# Patient Record
Sex: Male | Born: 1990 | Race: White | Hispanic: No | Marital: Single | Smoking: Current every day smoker
Health system: Southern US, Community
[De-identification: ages and names within clinical notes are randomized; demographics above are authoritative.]

## PROBLEM LIST (undated history)

## (undated) DIAGNOSIS — J45909 Unspecified asthma, uncomplicated: Secondary | ICD-10-CM

## (undated) HISTORY — PX: OTHER SURGICAL HISTORY: SHX169

## (undated) HISTORY — PX: LYMPH GLAND EXCISION: SHX13

---

## 2012-03-26 ENCOUNTER — Emergency Department (HOSPITAL_COMMUNITY)
Admission: EM | Admit: 2012-03-26 | Discharge: 2012-03-26 | Disposition: A | Discharge status: Home / Self Care | Payer: PRIVATE HEALTH INSURANCE | Attending: Emergency Medicine | Admitting: Emergency Medicine

## 2012-03-26 ENCOUNTER — Encounter (HOSPITAL_COMMUNITY): Payer: Self-pay | Admitting: *Deleted

## 2012-03-26 DIAGNOSIS — J45909 Unspecified asthma, uncomplicated: Secondary | ICD-10-CM | POA: Insufficient documentation

## 2012-03-26 DIAGNOSIS — F17200 Nicotine dependence, unspecified, uncomplicated: Secondary | ICD-10-CM | POA: Insufficient documentation

## 2012-03-26 DIAGNOSIS — F1910 Other psychoactive substance abuse, uncomplicated: Secondary | ICD-10-CM | POA: Insufficient documentation

## 2012-03-26 HISTORY — DX: Unspecified asthma, uncomplicated: J45.909

## 2012-03-26 NOTE — BH Assessment (Signed)
Assessment Note   Joe Lopez is an 21 y.o. male. PT REPORTS HE OCCASIONALLY TAKES OPANA AND XANAX PILLS BUT NOT OFTEN ENOUGH FOR DETOX.  HE DENIES S/I, H/I AND IS NOT PSYCHOTIC.  PT REFERRED TO [facility].      Axis I: Substance Abuse Axis II: Deferred Axis III:  Past Medical History  Diagnosis Date  . Asthma    Axis IV: problems related to social environment Axis V: 61-70 mild symptoms     Past Medical History:  Past Medical History  Diagnosis Date  . Asthma     Past Surgical History  Procedure Date  . Lymph gland excision   . Pyloric stenosis     Family History: History reviewed. No pertinent family history.  Social History:  reports that he has been smoking.  He does not have any smokeless tobacco history on file. He reports that he drinks alcohol. He reports that he uses illicit drugs.  Additional Social History:  Alcohol / Drug Use Pain Medications: OPANA History of alcohol / drug use?: Yes Substance #1 Name of Substance 1: OPIATE-OPANA 1 - Age of First Use: 19 1 - Amount (size/oz): 1-2 PILLS  1 - Frequency: WEEKLY 1 - Duration: 2 YRS 1 - Last Use / Amount: 03/26/11 1 PILL Substance #2 Name of Substance 2: XANAX 2 - Age of First Use: 19 2 - Amount (size/oz): 1 PILL 2 - Frequency: EVERY 2 MONTHS 2 - Duration: 2 YEARS 2 - Last Use / Amount: 1 MONTH AGO  CIWA: CIWA-Ar BP: 124/82 mmHg Pulse Rate: 80  Nausea and Vomiting: no nausea and no vomiting Tactile Disturbances: none Tremor: no tremor Auditory Disturbances: not present Paroxysmal Sweats: no sweat visible Visual Disturbances: not present Anxiety: no anxiety, at ease Headache, Fullness in Head: none present Agitation: normal activity Orientation and Clouding of Sensorium: oriented and can do serial additions CIWA-Ar Total: 0  COWS:    Allergies:  Allergies  Allergen Reactions  . Keflex (Cephalexin)     Home Medications:  (Not in a hospital admission)  OB/GYN Status:  No LMP for  male patient.  General Assessment Data Location of Assessment: [facility] Assessment Services ACT Assessment: Yes Living Arrangements: Parent Can pt return to current living arrangement?: Yes Admission Status: Voluntary Is patient capable of signing voluntary admission?: Yes Transfer from: Acute Hospital Referral Source: MD     Risk to self Suicidal Ideation: No Suicidal Intent: No Is patient at risk for suicide?: No Suicidal Plan?: No Access to Means: No What has been your use of drugs/alcohol within the last 12 months?: OPIATES AND BENZO Previous Attempts/Gestures: No How many times?: 0  Other Self Harm Risks: NA Triggers for Past Attempts: None known Intentional Self Injurious Behavior: None Family Suicide History: No Recent stressful life event(s): Job Loss Persecutory voices/beliefs?: No Depression: No Substance abuse history and/or treatment for substance abuse?: No Suicide prevention information given to non-admitted patients: Not applicable  Risk to Others Homicidal Ideation: No Thoughts of Harm to Others: No Current Homicidal Intent: No Current Homicidal Plan: No Access to Homicidal Means: No History of harm to others?: No Assessment of Violence: None Noted Violent Behavior Description: NA Does patient have access to weapons?: No Criminal Charges Pending?: No Does patient have a court date: No  Psychosis Hallucinations: None noted Delusions: None noted  Mental Status Report Appear/Hygiene: Improved Eye Contact: Good Motor Activity: Freedom of movement Speech: Logical/coherent Level of Consciousness: Alert Mood: Labile Affect: Appropriate to circumstance Anxiety Level: Minimal  Thought Processes: Coherent;Relevant Judgement: Unimpaired Orientation: Person;Place;Time;Situation Obsessive Compulsive Thoughts/Behaviors: None  Cognitive Functioning Concentration: Normal Memory: Recent Intact;Remote Intact IQ: Average Insight: Good Impulse Control:  Fair Appetite: Good Sleep: No Change Total Hours of Sleep: 8  Vegetative Symptoms: None  ADLScreening [facility] Assessment Services) Patient's cognitive ability adequate to safely complete daily activities?: Yes Patient able to express need for assistance with ADLs?: Yes Independently performs ADLs?: Yes  Abuse/Neglect [facility]) Physical Abuse: Denies Verbal Abuse: Denies Sexual Abuse: Denies  Prior Inpatient Therapy Prior Inpatient Therapy: No  Prior Outpatient Therapy Prior Outpatient Therapy: No  ADL Screening (condition at time of admission) Patient's cognitive ability adequate to safely complete daily activities?: Yes Patient able to express need for assistance with ADLs?: Yes Independently performs ADLs?: Yes Weakness of Legs: None Weakness of Arms/Hands: None  Home Assistive Devices/Equipment Home Assistive Devices/Equipment: None  Therapy Consults (therapy consults require a physician order) PT Evaluation Needed: No OT Evalulation Needed: No SLP Evaluation Needed: No Abuse/Neglect Assessment (Assessment to be complete while patient is alone) Physical Abuse: Denies Verbal Abuse: Denies Sexual Abuse: Denies Exploitation of patient/patient's resources: Denies Values / Beliefs Cultural Requests During Hospitalization: None Spiritual Requests During Hospitalization: None Consults Spiritual Care Consult Needed: No Social Work Consult Needed: No      Additional Information 1:1 In Past 12 Months?: No CIRT Risk: No Elopement Risk: No Does patient have medical clearance?: No     Disposition:                                                                                                                               Disposition Disposition of Patient: Referred to [facility]) Patient referred to: Other (Comment) [facility])  On Site Evaluation by:   Reviewed with Physician:     Hattie Perch Winford 03/26/2012 10:18 PM

## 2012-03-26 NOTE — ED Notes (Signed)
Joe Lopez  (ACT) in to speak with pt and mother.   Per mother, she feels pt needs detox from Opana that pt takes approx 2 times a week.   Pt is awake, alert, nad

## 2012-03-26 NOTE — ED Notes (Signed)
Depression, substance abuse opana and xanax.  Over 1 year.

## 2012-03-27 NOTE — ED Provider Notes (Signed)
History     CSN: [id number]  Arrival date & time 03/26/12  2023   First MD Initiated Contact with Patient 03/26/12 2148      Chief Complaint  Patient presents with  . Medical Clearance    (Consider location/radiation/quality/duration/timing/severity/associated sxs/prior treatment) HPI Comments: Patient with no medical hx, no diagnosed psych hx comes in with cc of medical clearance. Patient admits to prescription pill abuse, and wants to clean up. He denies any illicit use today. No suicidal or homicidal ideation. Patient thinks he might have bipolar, but no psych eval done. He does have a pcp.   The history is provided by the patient and a parent.    Past Medical History  Diagnosis Date  . Asthma     Past Surgical History  Procedure Date  . Lymph gland excision   . Pyloric stenosis     History reviewed. No pertinent family history.  History  Substance Use Topics  . Smoking status: Current Everyday Smoker  . Smokeless tobacco: Not on file  . Alcohol Use: Yes      Review of Systems  Constitutional: Negative for activity change and appetite change.  Respiratory: Negative for cough and shortness of breath.   Cardiovascular: Negative for chest pain.  Gastrointestinal: Negative for abdominal pain.  Genitourinary: Negative for dysuria.  Psychiatric/Behavioral: Negative for suicidal ideas, hallucinations, behavioral problems, self-injury and agitation.    Allergies  Keflex  Home Medications  No current outpatient prescriptions on file.  BP 124/82  Pulse 80  Temp 98.3 F (36.8 C) (Oral)  Resp 18  Ht 5\' 11"  (1.803 m)  Wt 175 lb (79.379 kg)  BMI 24.41 kg/m2  SpO2 100%  Physical Exam  Constitutional: He is oriented to person, place, and time. He appears well-developed.  HENT:  Head: Normocephalic.  Eyes: Pupils are equal, round, and reactive to light.  Neck: Normal range of motion. Neck supple.  Cardiovascular: Normal rate, regular rhythm and normal heart  sounds.   No murmur heard. Pulmonary/Chest: Effort normal. No respiratory distress.  Abdominal: Soft. Bowel sounds are normal. He exhibits no distension. There is no tenderness.  Neurological: He is alert and oriented to person, place, and time.  Skin: Skin is warm and dry.  Psychiatric: He has a normal mood and affect. His behavior is normal. Judgment and thought content normal.    ED Course  Procedures (including critical care time)  Labs Reviewed - No data to display No results found.   1. Substance abuse       MDM  Patient comes in for rehab clearance. Behavioral health consulted to provide more resources. Patient has no medical problems, no complains that are organic otherwise. No suicidal ideations, homicidal ideations.        Derwood Kaplan, MD 03/27/12 [phone]

## 2014-05-03 ENCOUNTER — Emergency Department (HOSPITAL_COMMUNITY): Payer: PRIVATE HEALTH INSURANCE

## 2014-05-03 ENCOUNTER — Encounter (HOSPITAL_COMMUNITY): Payer: Self-pay | Admitting: Emergency Medicine

## 2014-05-03 ENCOUNTER — Emergency Department (HOSPITAL_COMMUNITY)
Admission: EM | Admit: 2014-05-03 | Discharge: 2014-05-05 | Disposition: A | Discharge status: Other Acute Inpatient Hospital | Payer: PRIVATE HEALTH INSURANCE | Attending: Emergency Medicine | Admitting: Emergency Medicine

## 2014-05-03 DIAGNOSIS — J45901 Unspecified asthma with (acute) exacerbation: Secondary | ICD-10-CM | POA: Diagnosis not present

## 2014-05-03 DIAGNOSIS — F1210 Cannabis abuse, uncomplicated: Secondary | ICD-10-CM | POA: Diagnosis not present

## 2014-05-03 DIAGNOSIS — F1310 Sedative, hypnotic or anxiolytic abuse, uncomplicated: Secondary | ICD-10-CM

## 2014-05-03 DIAGNOSIS — R630 Anorexia: Secondary | ICD-10-CM | POA: Diagnosis not present

## 2014-05-03 DIAGNOSIS — F17200 Nicotine dependence, unspecified, uncomplicated: Secondary | ICD-10-CM | POA: Insufficient documentation

## 2014-05-03 DIAGNOSIS — R4789 Other speech disturbances: Secondary | ICD-10-CM | POA: Insufficient documentation

## 2014-05-03 DIAGNOSIS — R634 Abnormal weight loss: Secondary | ICD-10-CM | POA: Diagnosis not present

## 2014-05-03 DIAGNOSIS — F1910 Other psychoactive substance abuse, uncomplicated: Secondary | ICD-10-CM

## 2014-05-03 DIAGNOSIS — F1123 Opioid dependence with withdrawal: Secondary | ICD-10-CM

## 2014-05-03 DIAGNOSIS — Z8669 Personal history of other diseases of the nervous system and sense organs: Secondary | ICD-10-CM | POA: Diagnosis not present

## 2014-05-03 DIAGNOSIS — F1410 Cocaine abuse, uncomplicated: Secondary | ICD-10-CM | POA: Insufficient documentation

## 2014-05-03 LAB — URINALYSIS, ROUTINE W REFLEX MICROSCOPIC
BILIRUBIN URINE: NEGATIVE
Glucose, UA: NEGATIVE mg/dL
HGB URINE DIPSTICK: NEGATIVE
KETONES UR: NEGATIVE mg/dL
Leukocytes, UA: NEGATIVE
Nitrite: NEGATIVE
Protein, ur: NEGATIVE mg/dL
SPECIFIC GRAVITY, URINE: 1.024 (ref 1.005–1.030)
UROBILINOGEN UA: 1 mg/dL (ref 0.0–1.0)
pH: 7 (ref 5.0–8.0)

## 2014-05-03 LAB — URINE MICROSCOPIC-ADD ON

## 2014-05-03 LAB — ACETAMINOPHEN LEVEL

## 2014-05-03 LAB — CBC WITH DIFFERENTIAL/PLATELET
Basophils Absolute: 0.1 K/uL (ref 0.0–0.1)
Basophils Relative: 1 % (ref 0–1)
Eosinophils Absolute: 0.4 K/uL (ref 0.0–0.7)
Eosinophils Relative: 4 % (ref 0–5)
HCT: 37.8 % — ABNORMAL LOW (ref 39.0–52.0)
Hemoglobin: 12.8 g/dL — ABNORMAL LOW (ref 13.0–17.0)
Lymphocytes Relative: 34 % (ref 12–46)
Lymphs Abs: 3.3 K/uL (ref 0.7–4.0)
MCH: 29.9 pg (ref 26.0–34.0)
MCHC: 33.9 g/dL (ref 30.0–36.0)
MCV: 88.3 fL (ref 78.0–100.0)
Monocytes Absolute: 1 K/uL (ref 0.1–1.0)
Monocytes Relative: 10 % (ref 3–12)
Neutro Abs: 4.9 K/uL (ref 1.7–7.7)
Neutrophils Relative %: 51 % (ref 43–77)
Platelets: 274 K/uL (ref 150–400)
RBC: 4.28 MIL/uL (ref 4.22–5.81)
RDW: 13.2 % (ref 11.5–15.5)
WBC: 9.7 K/uL (ref 4.0–10.5)

## 2014-05-03 LAB — COMPREHENSIVE METABOLIC PANEL
ALT: 10 U/L (ref 0–53)
ANION GAP: 9 (ref 5–15)
AST: 12 U/L (ref 0–37)
Albumin: 3.6 g/dL (ref 3.5–5.2)
Alkaline Phosphatase: 82 U/L (ref 39–117)
BUN: 7 mg/dL (ref 6–23)
CO2: 31 meq/L (ref 19–32)
CREATININE: 1.02 mg/dL (ref 0.50–1.35)
Calcium: 9.4 mg/dL (ref 8.4–10.5)
Chloride: 100 mEq/L (ref 96–112)
GLUCOSE: 78 mg/dL (ref 70–99)
Potassium: 4.2 mEq/L (ref 3.7–5.3)
Sodium: 140 mEq/L (ref 137–147)
Total Bilirubin: 0.5 mg/dL (ref 0.3–1.2)
Total Protein: 7.5 g/dL (ref 6.0–8.3)

## 2014-05-03 LAB — RAPID URINE DRUG SCREEN, HOSP PERFORMED
Amphetamines: NOT DETECTED
Barbiturates: NOT DETECTED
Benzodiazepines: POSITIVE — AB
Cocaine: POSITIVE — AB
Opiates: NOT DETECTED
Tetrahydrocannabinol: POSITIVE — AB

## 2014-05-03 LAB — SALICYLATE LEVEL: Salicylate Lvl: <2 mg/dL — ABNORMAL LOW (ref 2.8–20.0)

## 2014-05-03 LAB — ETHANOL: Alcohol, Ethyl (B): <11 mg/dL (ref 0–11)

## 2014-05-03 MED ORDER — NICOTINE 21 MG/24HR TD PT24
21.0000 mg | MEDICATED_PATCH | Freq: Every day | TRANSDERMAL | Status: DC
  Administered 2014-05-04: 21 mg via TRANSDERMAL
  Filled 2014-05-03: qty 1

## 2014-05-03 MED ORDER — CLONIDINE HCL 0.1 MG PO TABS
0.1000 mg | ORAL_TABLET | Freq: Four times a day (QID) | ORAL | Status: DC
  Administered 2014-05-04 (×3): 0.1 mg via ORAL
  Filled 2014-05-03 (×3): qty 1

## 2014-05-03 MED ORDER — LOPERAMIDE HCL 2 MG PO CAPS: 2.0000 mg | ORAL_CAPSULE | ORAL | Status: DC | PRN

## 2014-05-03 MED ORDER — ALUM & MAG HYDROXIDE-SIMETH 200-200-20 MG/5ML PO SUSP: 30.0000 mL | ORAL | Status: DC | PRN

## 2014-05-03 MED ORDER — CLONIDINE HCL 0.1 MG PO TABS: 0.1000 mg | ORAL_TABLET | Freq: Every day | ORAL | Status: DC

## 2014-05-03 MED ORDER — ACETAMINOPHEN 325 MG PO TABS
650.0000 mg | ORAL_TABLET | ORAL | Status: DC | PRN
  Administered 2014-05-04: 650 mg via ORAL
  Filled 2014-05-03: qty 2

## 2014-05-03 MED ORDER — HYDROXYZINE HCL 25 MG PO TABS
25.0000 mg | ORAL_TABLET | Freq: Four times a day (QID) | ORAL | Status: DC | PRN
Start: 2014-05-03 — End: 2014-05-05
  Administered 2014-05-04: 25 mg via ORAL
  Filled 2014-05-03: qty 1

## 2014-05-03 MED ORDER — IBUPROFEN 200 MG PO TABS: 600.0000 mg | ORAL_TABLET | Freq: Three times a day (TID) | ORAL | Status: DC | PRN

## 2014-05-03 MED ORDER — ONDANSETRON 4 MG PO TBDP: 4.0000 mg | ORAL_TABLET | Freq: Four times a day (QID) | ORAL | Status: DC | PRN

## 2014-05-03 MED ORDER — DICYCLOMINE HCL 20 MG PO TABS
20.0000 mg | ORAL_TABLET | Freq: Four times a day (QID) | ORAL | Status: DC | PRN
Start: 2014-05-03 — End: 2014-05-05

## 2014-05-03 MED ORDER — CLONIDINE HCL 0.1 MG PO TABS: 0.1000 mg | ORAL_TABLET | ORAL | Status: DC

## 2014-05-03 MED ORDER — METHOCARBAMOL 500 MG PO TABS
500.0000 mg | ORAL_TABLET | Freq: Three times a day (TID) | ORAL | Status: DC | PRN
  Administered 2014-05-04: 500 mg via ORAL
  Filled 2014-05-03: qty 1

## 2014-05-03 MED ORDER — ZOLPIDEM TARTRATE 5 MG PO TABS: 5.0000 mg | ORAL_TABLET | Freq: Every evening | ORAL | Status: DC | PRN

## 2014-05-03 MED ORDER — LORAZEPAM 1 MG PO TABS: 1.0000 mg | ORAL_TABLET | Freq: Three times a day (TID) | ORAL | Status: DC | PRN

## 2014-05-03 MED ORDER — ONDANSETRON HCL 4 MG PO TABS: 4.0000 mg | ORAL_TABLET | Freq: Three times a day (TID) | ORAL | Status: DC | PRN

## 2014-05-03 MED ORDER — NAPROXEN 250 MG PO TABS: 500.0000 mg | ORAL_TABLET | Freq: Two times a day (BID) | ORAL | Status: DC | PRN

## 2014-05-03 NOTE — ED Provider Notes (Signed)
CSN: [id number]     Arrival date & time 05/03/14  2018 History  This chart was scribed for non-physician practitioner, Junious Silk, PA-C working with Layla Maw Ward, DO by Greggory Stallion, ED scribe. This patient was seen in room TR06C/TR06C and the patient's care was started at 9:47 PM.   Chief Complaint  Patient presents with  . Addiction Problem   The history is provided by the patient. No language interpreter was used.   HPI Comments: Joe Lopez is a 23 y.o. male who presents to the Emergency Department requesting detox from opiates. States he has been using for 4 years but PO or snorting. Pt states he additionally took two 0.5 mg klonopin before coming into the ED today. Denies any other drug use today but states he took methadone all weekend. States he was taking about 20 mg methadone per day. Denies SI/HI. Family states that the pt had a seizure last night. They state the pt's father stated that he had two seizures earlier this week. The family members cannot describe the seizure to any extent. The patient reports he synopized. He reports that he was not withdrawing at the time of these "seizures" or LOC. Family reports that he has had decreased appetite in the last few weeks. States that he has lost about 20 pounds in the last 3 weeks due to not eating. Pt was recently given a z-pack for bronchitis. States he finished it a few days ago. Reports continued cough. Family states that they spoke to [facility] and was told to get medically cleared then he could go there for inpatient detox. No suicidal or homicidal ideation.   Past Medical History  Diagnosis Date  . Asthma    Past Surgical History  Procedure Laterality Date  . Lymph gland excision    . Pyloric stenosis     History reviewed. No pertinent family history. History  Substance Use Topics  . Smoking status: Current Every Day Smoker  . Smokeless tobacco: Not on file  . Alcohol Use: Yes    Review of Systems   Constitutional: Positive for appetite change.  Neurological: Positive for seizures.  Psychiatric/Behavioral: Negative for suicidal ideas.  All other systems reviewed and are negative.  Allergies  Keflex  Home Medications   Prior to Admission medications   Medication Sig Start Date End Date Taking? Authorizing Provider  PREDNISONE, PAK, PO Take 1 tablet by mouth See admin instructions. Patient is on taper dose. Patient  is not sure where he is in the course of the medication.   Yes Historical Provider, MD   BP 113/67  Pulse 69  Temp(Src) 97.1 F (36.2 C)  Resp 10  SpO2 99%  Physical Exam  Nursing note and vitals reviewed. Constitutional: He is oriented to person, place, and time. He appears well-developed and well-nourished. No distress.  Somnolent but easily aroused. Slurred speech.   HENT:  Head: Normocephalic and atraumatic.  Right Ear: External ear normal.  Left Ear: External ear normal.  Nose: Nose normal.  Eyes: Conjunctivae are normal.  Neck: Normal range of motion. No tracheal deviation present.  Cardiovascular: Normal rate, regular rhythm and normal heart sounds.   Pulmonary/Chest: Effort normal. No stridor. He has wheezes.  Abdominal: Soft. He exhibits no distension. There is no tenderness.  Musculoskeletal: Normal range of motion.  Neurological: He is alert and oriented to person, place, and time.  Skin: Skin is warm and dry. He is not diaphoretic.  Psychiatric: He has a normal mood  and affect. His behavior is normal. He expresses no homicidal and no suicidal ideation.    ED Course  Procedures (including critical care time)  DIAGNOSTIC STUDIES: Oxygen Saturation is 99% on RA, normal by my interpretation.    COORDINATION OF CARE: 9:52 PM-Discussed treatment plan which includes blood work, chest xray and head CT with pt at bedside and pt agreed to plan. Advised pt that if blood work looks okay, he will be discharged with out patient resources. Spoke with pt's  family and told them that pt's are no longer admitted for opiate detox.  Labs Review Labs Reviewed  CBC WITH DIFFERENTIAL - Abnormal; Notable for the following:    Hemoglobin 12.8 (*)    HCT 37.8 (*)    All other components within normal limits  SALICYLATE LEVEL - Abnormal; Notable for the following:    Salicylate Lvl <2.0 (*)    All other components within normal limits  URINE RAPID DRUG SCREEN (HOSP PERFORMED) - Abnormal; Notable for the following:    Cocaine POSITIVE (*)    Benzodiazepines POSITIVE (*)    Tetrahydrocannabinol POSITIVE (*)    All other components within normal limits  URINALYSIS, ROUTINE W REFLEX MICROSCOPIC - Abnormal; Notable for the following:    Color, Urine AMBER (*)    APPearance TURBID (*)    All other components within normal limits  URINE MICROSCOPIC-ADD ON - Abnormal; Notable for the following:    Casts GRANULAR CAST (*)    All other components within normal limits  URINE CULTURE  COMPREHENSIVE METABOLIC PANEL  ACETAMINOPHEN LEVEL  ETHANOL    Imaging Review Ct Head Wo Contrast  05/03/2014   CLINICAL DATA:  Severe headache.  EXAM: CT HEAD WITHOUT CONTRAST  TECHNIQUE: Contiguous axial images were obtained from the base of the skull through the vertex without intravenous contrast.  COMPARISON:  None.  FINDINGS: The ventricles and sulci are normal. No intraparenchymal hemorrhage, mass effect nor midline shift. No acute large vascular territory infarcts.  No abnormal extra-axial fluid collections. Basal cisterns are patent.  No skull fracture. Minimal hyperostosis frontalis internus. The included ocular globes and orbital contents are non-suspicious. Mild paranasal sinus mucosal thickening without air-fluid levels. The mastoid air cells are well aerated.  IMPRESSION: No acute intracranial process ; normal noncontrast CT of the head.  Mild paranasal sinusitis.   Electronically Signed   By: Awilda Metro   On: 05/03/2014 23:08   Dg Chest Port 1  View  05/03/2014   CLINICAL DATA:  Shortness of breath. Drug overdose. History of asthma.  EXAM: PORTABLE CHEST - 1 VIEW  COMPARISON:  04/06/2014  FINDINGS: Normal heart size and pulmonary vascularity. No focal airspace disease or consolidation in the lungs. No blunting of costophrenic angles. No pneumothorax. Mediastinal contours appear intact. Old fracture deformity of the right clavicle.  IMPRESSION: No active disease.   Electronically Signed   By: Burman Nieves M.D.   On: 05/03/2014 22:42     EKG Interpretation None      MDM   Final diagnoses:  Polysubstance abuse    Patient presents to ED for detox from multiple substances. No SI/HI. Patient willing to stay for TTS evaluation to be placed in inpatient detox facility. Given benzodiazapine abuse, patient would likely benefit from detox. Family is with patient and highly motivated for patient to detox. No SI/HI. Vital signs stable. Patient medically cleared. Discussed case with Dr. Elesa Massed who agrees with plan. Patient / Family / Caregiver informed of clinical course, understand  medical decision-making process, and agree with plan.   I personally performed the services described in this documentation, which was scribed in my presence. The recorded information has been reviewed and is accurate.  Mora Bellman, PA-C 05/04/14 [phone]

## 2014-05-03 NOTE — ED Notes (Signed)
Per pt: mom wants him to seek help for opiate addiction for the past four years. Pt states he uses by PO or snorting. Pt states he took 2.5 mg klonopin  x 2 before coming to ED speech is slurred. Pt also dx with pneumonia, bronchitis, tonsillitis last friday. Pt denies SI/HI.

## 2014-05-04 DIAGNOSIS — F1910 Other psychoactive substance abuse, uncomplicated: Secondary | ICD-10-CM

## 2014-05-04 NOTE — ED Provider Notes (Signed)
Medical screening examination/treatment/procedure(s) were performed by non-physician practitioner and as supervising physician I was immediately available for consultation/collaboration.   EKG Interpretation   Date/Time:  Tuesday May 03 2014 23:27:44 EDT Ventricular Rate:  51 PR Interval:  158 QRS Duration: 92 QT Interval:  414 QTC Calculation: 381 R Axis:   73 Text Interpretation:  Sinus bradycardia with marked sinus arrhythmia  Otherwise normal ECG Confirmed by WARD,  DO, KRISTEN ([id number]) on 05/03/2014  11:31:34 PM        Layla Maw Ward, DO 05/04/14 1655

## 2014-05-04 NOTE — ED Notes (Signed)
Ardeen Jourdain (mother) [phone]

## 2014-05-04 NOTE — Progress Notes (Signed)
Pt's referral has been faxed to the following facilities for detox tx: [facility]- per [facility] can fax [facility]- per [facility] can fax [facility]- per Albin Felling can fax [facility]- per Rosanne Ashing can fax for review  [facility]AC[facility]: [facility]- per Shirley Muscat- per [facility]- per [facility] Guise- per [facility]- per [facility]- per [facility]- gero only per [facility]- per [facility] per Ulice Bold- per [facility]- per [facility] Disposition [facility]

## 2014-05-04 NOTE — ED Notes (Signed)
He states he is not feeling any symptoms of withdrawal at this time,. States he is able to go a day or two without using and not have any sickness. States he may feel a little less energy. States his mom wants him to clean up so he can take his baby mama to court to get visitation. States he hasnt seen his child in about a year. Pt is pleasant. He is eating and drinking well,. He denies any history of seizure. States he had been sick and not eating or drinking for a week or so and he remembers passing out 1-2 times when he would stand up. States he doesn't think he has had a seizure and he also states he was not in withdrawal during this time. He states he has gotten off opiates in the past by using suboxone. Pt with good outlook and positive attitude to get his self detoxed. States he uses about 30 mg of roxicet daily

## 2014-05-04 NOTE — Consult Note (Signed)
[facility] Face-to-Face Psychiatry Consult   Reason for Consult:  Polysubstance abuse including opioids Referring Physician:  Dr. [name] is an 23 y.o. male. Total Time spent with patient: 45 minutes  Assessment: AXIS I:  Polysubstance abuse  (opioid dependence, benzodiazepines abuse, cocaine abuse and cannabis abuse)  AXIS II:  Deferred AXIS III:   Past Medical History  Diagnosis Date  . Asthma    AXIS IV:  economic problems, other psychosocial or environmental problems, problems related to social environment and problems with primary support group AXIS V:  41-50 serious symptoms  Plan:   Case discussed with the staff at him and Dr. Darl Householder Recommend psychiatric Inpatient admission when medically cleared. Supportive therapy provided about ongoing stressors.  Subjective:   Joe Lopez is a 23 y.o. male patient admitted with substance abuse and intoxication.  HPI: Joe Lopez is a 23 y.o. male who presents to the Emergency Department requesting detox from opiates, benzodiazepines and a recent history of withdrawal seizures. Patient complains of body pains, stomach cramps, sweating and feeling hot and cold.  patient is scared that is going to have full-blown opiate withdrawal withdrawal if he does not get detox treatment. Patient reported has been tired of using drugs, unable to function with his job and relationships and seeking for professional assistance. Patient reported he has no previous as detox treatment at rehabilitation services. He has been using for 4 years but PO or snorting.  patient stated he has abused benzodiazepines before coming to the hospital and methadone all weekend.  he was also abuser roxy in the recent past. Patient states he was taking about 20 mg methadone per day from [facility] . Reportedly patient mother and girlfriend states that the pt had a seizure last night.  reportedly patient is not able to care for his basic needs, patient's family reports that  he has had decreased appetite in the last few weeks. States that he has lost about 20 pounds in the last 3 weeks due to not eating.  patient denies current suicidal ideation, homicidal ideation, intention or plans. Patient is no evidence of psychosis  patient urine drug screen is positive for cocaine, benzodiazepines, tetrahydrocannabinol and blood alcohol level is not significant.   HPI Elements:   Location:  substance abuse. Quality:  poor. Severity:  acute vs chronic. Timing:  medical complication like withdrawal seizure.  Past Psychiatric History: Past Medical History  Diagnosis Date  . Asthma     reports that he has been smoking.  He does not have any smokeless tobacco history on file. He reports that he drinks alcohol. He reports that he uses illicit drugs. History reviewed. No pertinent family history.         Allergies:   Allergies  Allergen Reactions  . Keflex [Cephalexin]     rash    ACT Assessment Complete:  NO Objective: Blood pressure 113/69, pulse 69, temperature 97.4 F (36.3 C), temperature source Oral, resp. rate 18, SpO2 97.00%.There is no weight on file to calculate BMI. Results for orders placed during the hospital encounter of 05/03/14 (from the past 72 hour(s))  URINE RAPID DRUG SCREEN (HOSP PERFORMED)     Status: Abnormal   Collection Time    05/03/14 10:12 PM      Result Value Ref Range   Opiates NONE DETECTED  NONE DETECTED   Cocaine POSITIVE (*) NONE DETECTED   Benzodiazepines POSITIVE (*) NONE DETECTED   Amphetamines NONE DETECTED  NONE DETECTED  Tetrahydrocannabinol POSITIVE (*) NONE DETECTED   Barbiturates NONE DETECTED  NONE DETECTED   Comment:            DRUG SCREEN FOR MEDICAL PURPOSES     ONLY.  IF CONFIRMATION IS NEEDED     FOR ANY PURPOSE, NOTIFY LAB     WITHIN 5 DAYS.                LOWEST DETECTABLE LIMITS     FOR URINE DRUG SCREEN     Drug Class       Cutoff (ng/mL)     Amphetamine      1000     Barbiturate      200      Benzodiazepine   195     Tricyclics       093     Opiates          300     Cocaine          300     THC              50  URINALYSIS, ROUTINE W REFLEX MICROSCOPIC     Status: Abnormal   Collection Time    05/03/14 10:12 PM      Result Value Ref Range   Color, Urine AMBER (*) YELLOW   Comment: BIOCHEMICALS MAY BE AFFECTED BY COLOR   APPearance TURBID (*) CLEAR   Specific Gravity, Urine 1.024  1.005 - 1.030   pH 7.0  5.0 - 8.0   Glucose, UA NEGATIVE  NEGATIVE mg/dL   Hgb urine dipstick NEGATIVE  NEGATIVE   Bilirubin Urine NEGATIVE  NEGATIVE   Ketones, ur NEGATIVE  NEGATIVE mg/dL   Protein, ur NEGATIVE  NEGATIVE mg/dL   Urobilinogen, UA 1.0  0.0 - 1.0 mg/dL   Nitrite NEGATIVE  NEGATIVE   Leukocytes, UA NEGATIVE  NEGATIVE  URINE MICROSCOPIC-ADD ON     Status: Abnormal   Collection Time    05/03/14 10:12 PM      Result Value Ref Range   Squamous Epithelial / LPF RARE  RARE   WBC, UA 0-2  <3 WBC/hpf   RBC / HPF 0-2  <3 RBC/hpf   Bacteria, UA RARE  RARE   Casts GRANULAR CAST (*) NEGATIVE   Urine-Other MUCOUS PRESENT    CBC WITH DIFFERENTIAL     Status: Abnormal   Collection Time    05/03/14 10:15 PM      Result Value Ref Range   WBC 9.7  4.0 - 10.5 K/uL   RBC 4.28  4.22 - 5.81 MIL/uL   Hemoglobin 12.8 (*) 13.0 - 17.0 g/dL   HCT 37.8 (*) 39.0 - 52.0 %   MCV 88.3  78.0 - 100.0 fL   MCH 29.9  26.0 - 34.0 pg   MCHC 33.9  30.0 - 36.0 g/dL   RDW 13.2  11.5 - 15.5 %   Platelets 274  150 - 400 K/uL   Neutrophils Relative % 51  43 - 77 %   Neutro Abs 4.9  1.7 - 7.7 K/uL   Lymphocytes Relative 34  12 - 46 %   Lymphs Abs 3.3  0.7 - 4.0 K/uL   Monocytes Relative 10  3 - 12 %   Monocytes Absolute 1.0  0.1 - 1.0 K/uL   Eosinophils Relative 4  0 - 5 %   Eosinophils Absolute 0.4  0.0 - 0.7 K/uL   Basophils Relative 1  0 - 1 %  Basophils Absolute 0.1  0.0 - 0.1 K/uL  COMPREHENSIVE METABOLIC PANEL     Status: None   Collection Time    05/03/14 10:15 PM      Result Value Ref Range    Sodium 140  137 - 147 mEq/L   Potassium 4.2  3.7 - 5.3 mEq/L   Chloride 100  96 - 112 mEq/L   CO2 31  19 - 32 mEq/L   Glucose, Bld 78  70 - 99 mg/dL   BUN 7  6 - 23 mg/dL   Creatinine, Ser 1.02  0.50 - 1.35 mg/dL   Calcium 9.4  8.4 - 10.5 mg/dL   Total Protein 7.5  6.0 - 8.3 g/dL   Albumin 3.6  3.5 - 5.2 g/dL   AST 12  0 - 37 U/L   ALT 10  0 - 53 U/L   Alkaline Phosphatase 82  39 - 117 U/L   Total Bilirubin 0.5  0.3 - 1.2 mg/dL   GFR calc non Af Amer >90  >90 mL/min   GFR calc Af Amer >90  >90 mL/min   Comment: (NOTE)     The eGFR has been calculated using the CKD EPI equation.     This calculation has not been validated in all clinical situations.     eGFR's persistently <90 mL/min signify possible Chronic Kidney     Disease.   Anion gap 9  5 - 15  ACETAMINOPHEN LEVEL     Status: None   Collection Time    05/03/14 10:15 PM      Result Value Ref Range   Acetaminophen (Tylenol), Serum <15.0  10 - 30 ug/mL   Comment:            THERAPEUTIC CONCENTRATIONS VARY     SIGNIFICANTLY. A RANGE OF 10-30     ug/mL MAY BE AN EFFECTIVE     CONCENTRATION FOR MANY PATIENTS.     HOWEVER, SOME ARE BEST TREATED     AT CONCENTRATIONS OUTSIDE THIS     RANGE.     ACETAMINOPHEN CONCENTRATIONS     >150 ug/mL AT 4 HOURS AFTER     INGESTION AND >50 ug/mL AT 12     HOURS AFTER INGESTION ARE     OFTEN ASSOCIATED WITH TOXIC     REACTIONS.  SALICYLATE LEVEL     Status: Abnormal   Collection Time    05/03/14 10:15 PM      Result Value Ref Range   Salicylate Lvl <4.1 (*) 2.8 - 20.0 mg/dL  ETHANOL     Status: None   Collection Time    05/03/14 10:15 PM      Result Value Ref Range   Alcohol, Ethyl (B) <11  0 - 11 mg/dL   Comment:            LOWEST DETECTABLE LIMIT FOR     SERUM ALCOHOL IS 11 mg/dL     FOR MEDICAL PURPOSES ONLY   Labs are reviewed and are pertinent for cocaine, benzo and THC.  Current Facility-Administered Medications  Medication Dose Route Frequency Provider Last Rate Last  Dose  . acetaminophen (TYLENOL) tablet 650 mg  650 mg Oral Q4H PRN Elwyn Lade, PA-C   650 mg at 05/04/14 0124  . alum & mag hydroxide-simeth (MAALOX/MYLANTA) 200-200-20 MG/5ML suspension 30 mL  30 mL Oral PRN Elwyn Lade, PA-C      . cloNIDine (CATAPRES) tablet 0.1 mg  0.1 mg Oral QID Jarrett Soho  S Merrell, PA-C   0.1 mg at 05/04/14 0758   Followed by  . [START ON 05/06/2014] cloNIDine (CATAPRES) tablet 0.1 mg  0.1 mg Oral BH-qamhs Elwyn Lade, PA-C       Followed by  . [START ON 05/09/2014] cloNIDine (CATAPRES) tablet 0.1 mg  0.1 mg Oral QAC breakfast Elwyn Lade, PA-C      . dicyclomine (BENTYL) tablet 20 mg  20 mg Oral Q6H PRN Elwyn Lade, PA-C      . hydrOXYzine (ATARAX/VISTARIL) tablet 25 mg  25 mg Oral Q6H PRN Elwyn Lade, PA-C   25 mg at 05/04/14 0758  . ibuprofen (ADVIL,MOTRIN) tablet 600 mg  600 mg Oral Q8H PRN Elwyn Lade, PA-C      . loperamide (IMODIUM) capsule 2-4 mg  2-4 mg Oral PRN Elwyn Lade, PA-C      . LORazepam (ATIVAN) tablet 1 mg  1 mg Oral Q8H PRN Elwyn Lade, PA-C      . methocarbamol (ROBAXIN) tablet 500 mg  500 mg Oral Q8H PRN Elwyn Lade, PA-C   500 mg at 05/04/14 3267  . naproxen (NAPROSYN) tablet 500 mg  500 mg Oral BID PRN Elwyn Lade, PA-C      . nicotine (NICODERM CQ - dosed in mg/24 hours) patch 21 mg  21 mg Transdermal Daily Elwyn Lade, PA-C   21 mg at 05/04/14 0759  . ondansetron (ZOFRAN) tablet 4 mg  4 mg Oral Q8H PRN Elwyn Lade, PA-C      . ondansetron (ZOFRAN-ODT) disintegrating tablet 4 mg  4 mg Oral Q6H PRN Elwyn Lade, PA-C      . zolpidem (AMBIEN) tablet 5 mg  5 mg Oral QHS PRN Elwyn Lade, PA-C       Current Outpatient Prescriptions  Medication Sig Dispense Refill  . PREDNISONE, PAK, PO Take 1 tablet by mouth See admin instructions. Patient is on taper dose. Patient  is not sure where he is in the course of the medication.        Psychiatric Specialty Exam: Physical Exam Full  physical performed in Emergency Department. I have reviewed this assessment and concur with its findings.   Review of Systems  Constitutional: Positive for malaise/fatigue.  Musculoskeletal: Positive for myalgias.  Neurological: Positive for weakness.  Psychiatric/Behavioral: Positive for depression and substance abuse. The patient is nervous/anxious and has insomnia.     Blood pressure 113/69, pulse 69, temperature 97.4 F (36.3 C), temperature source Oral, resp. rate 18, SpO2 97.00%.There is no weight on file to calculate BMI.  General Appearance: Disheveled and Guarded  Eye Sport and exercise psychologist::  Fair  Speech:  Clear and Coherent and Slow  Volume:  Decreased  Mood:  Anxious, Depressed, Hopeless and Worthless  Affect:  Depressed and Flat  Thought Process:  Coherent and Goal Directed  Orientation:  Full (Time, Place, and Person)  Thought Content:  WDL  Suicidal Thoughts:  No  Homicidal Thoughts:  No  Memory:  Immediate;   Fair Recent;   Fair  Judgement:  Impaired  Insight:  Lacking  Psychomotor Activity:  Psychomotor Retardation  Concentration:  Fair  Recall:  [place] of Knowledge:Fair  Language: Good  Akathisia:  NA  Handed:  Right  AIMS (if indicated):     Assets:  Communication Skills Desire for Improvement Housing Intimacy Leisure Time Physical Health Resilience Social Support Transportation Vocational/Educational  Sleep:      Musculoskeletal: Strength & Muscle Tone:  within normal limits Gait & Station: normal Patient leans: N/A  Treatment Plan Summary: Daily contact with patient to assess and evaluate symptoms and progress in treatment Medication management Recommended inpatient detox treatment for opiates, benzodiazepines and alcohol (clonidine and Librium protocols)   Celie Desrochers,JANARDHAHA R. 05/04/2014 9:59 AM

## 2014-05-04 NOTE — Progress Notes (Signed)
Pt's referral faxed to [facility], per Melissa pt being d/c'd this evening resulting in a male bed available.  Referral along with prescreen has been faxed for review.   Tomi Bamberger Disposition MHT

## 2014-05-05 LAB — URINE CULTURE
COLONY COUNT: NO GROWTH
CULTURE: NO GROWTH

## 2014-05-05 NOTE — ED Notes (Signed)
Contacted [facility] for transfer to [facility]

## 2014-05-05 NOTE — Progress Notes (Signed)
Writer followed up on the referral to [facility]. Per nurse Marylu Lund) the patient has been accepted to [facility] and he can to their facility after 9am.  Dr. Margot Chimes is the accepting doctor.   Writer informed the ER MD Dr. Hyacinth Meeker.   Writer informed the nurse Zella Ball) of the patients disposition. The number to give report to at [facility] is [phone].  The name of the nurse at [facility] to give report to is [place].      The nurse will arrange transportation through [facility] for the patient to go to [facility].

## 2014-05-05 NOTE — ED Provider Notes (Signed)
Accpeted at [facility]  Vida Roller, MD 05/05/14 [phone]

## 2014-05-05 NOTE — ED Notes (Signed)
Called mother to ask about clothing.

## 2016-04-15 IMAGING — CT CT HEAD W/O CM
1 of 2 series · 16 of 30 positions shown, 20 images · non-contrast
Comparison: None.

CLINICAL DATA: Severe headache.

EXAM:
CT HEAD WITHOUT CONTRAST
TECHNIQUE: Contiguous axial images were obtained from the base of the skull
through the vertex without intravenous contrast.

[Series 3: head 2.0 h70h · axial · 0.43mm/px · z∈[-99,+41]mm · 16 of 78 slices shown, 20 images]
[im 4/78  brain]
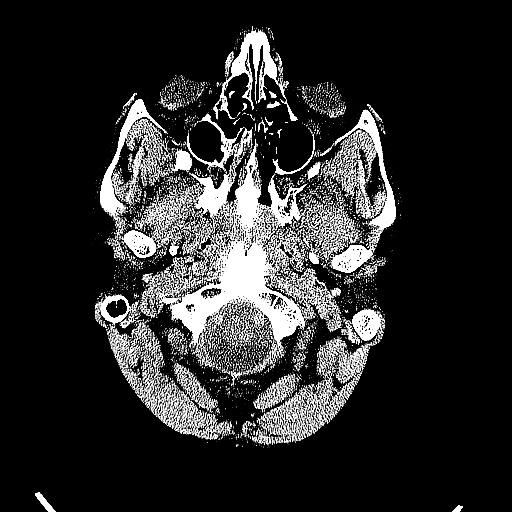
[im 4/78  bone]
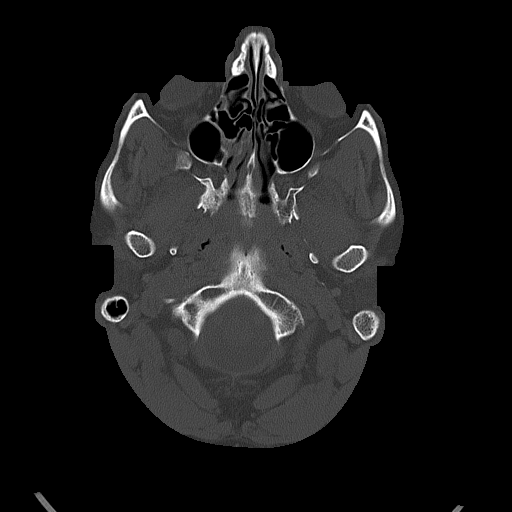
[im 8/78  brain]
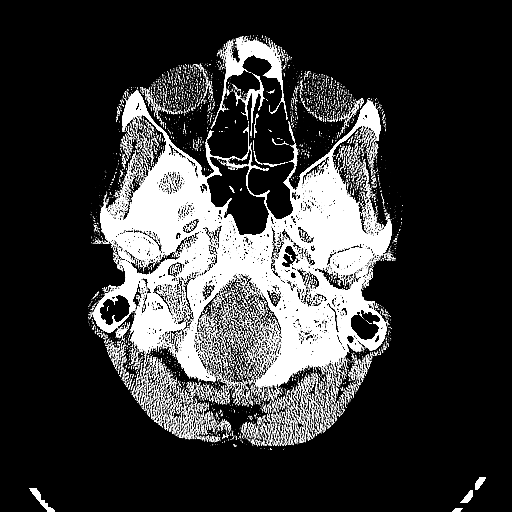
[im 12/78  brain]
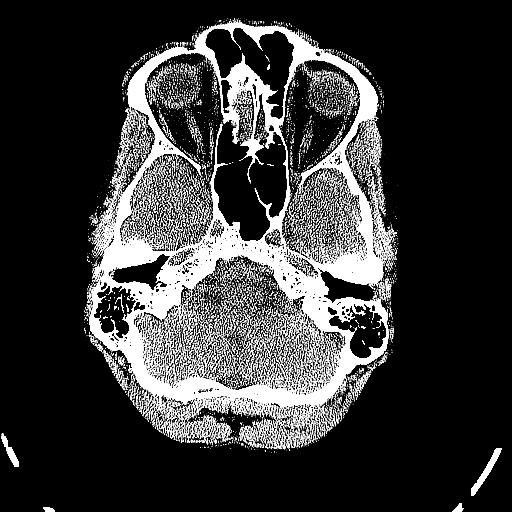
[im 20/78  brain]
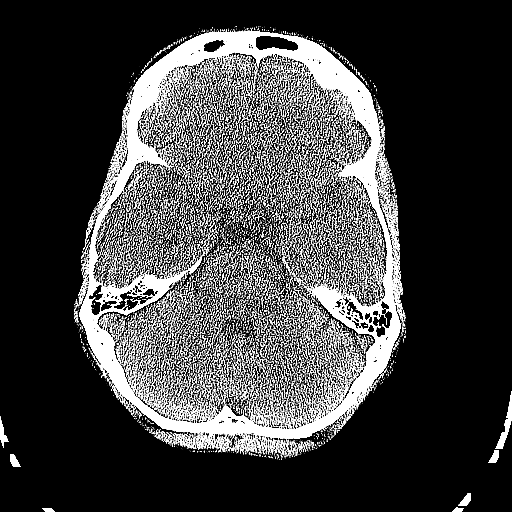
[im 24/78  brain]
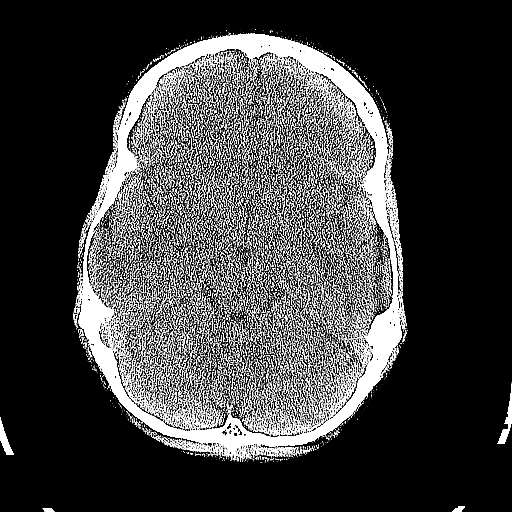
[im 24/78  bone]
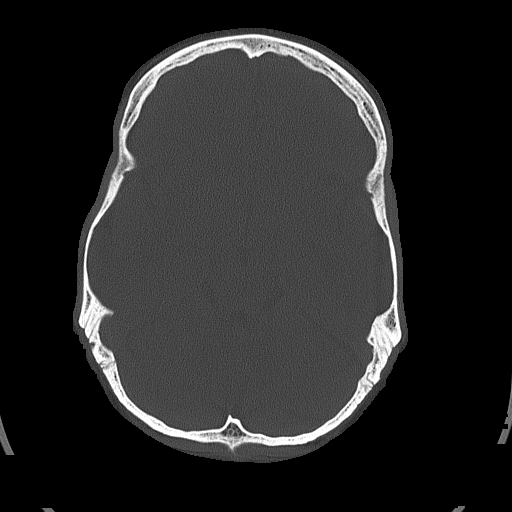
[im 27/78  brain]
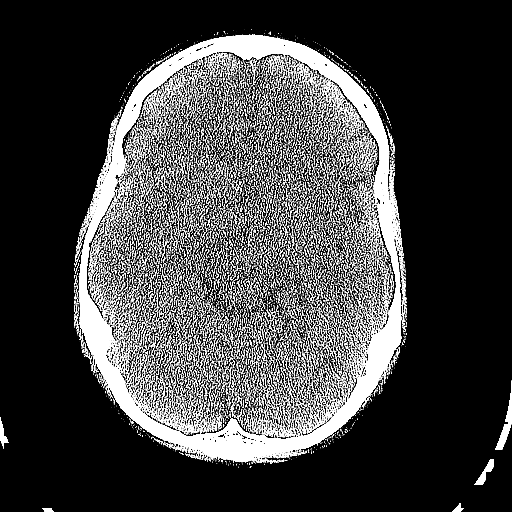
[im 31/78  brain]
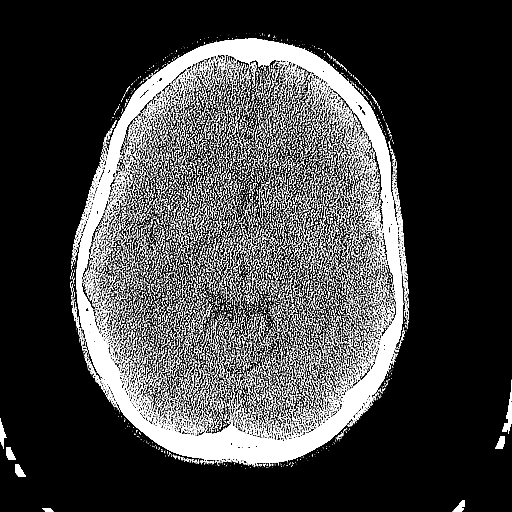
[im 35/78  brain]
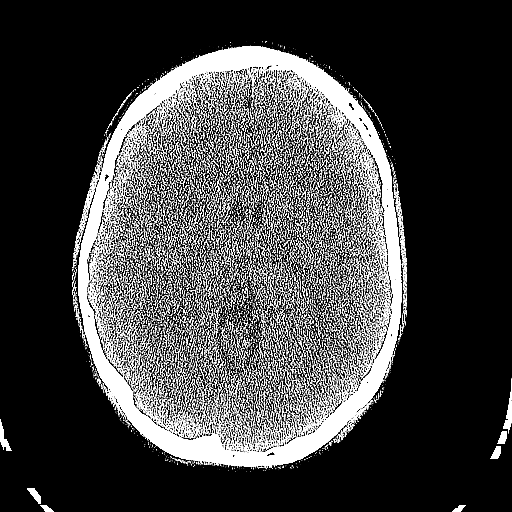
[im 43/78  brain]
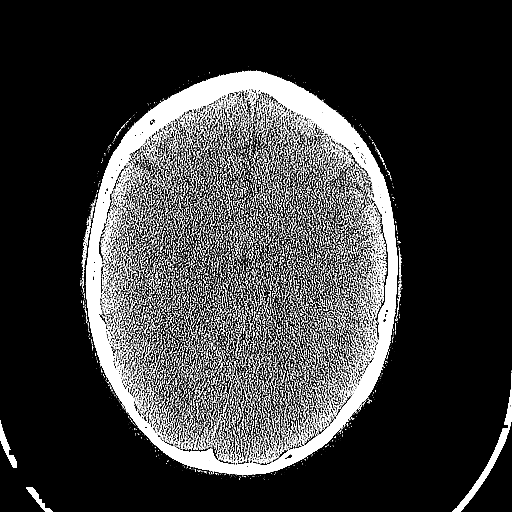
[im 43/78  bone]
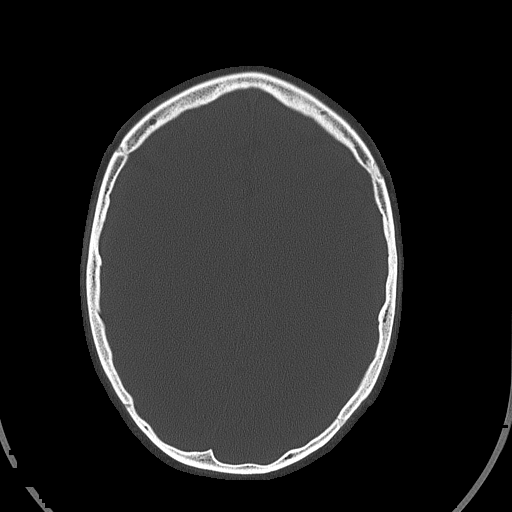
[im 47/78  brain]
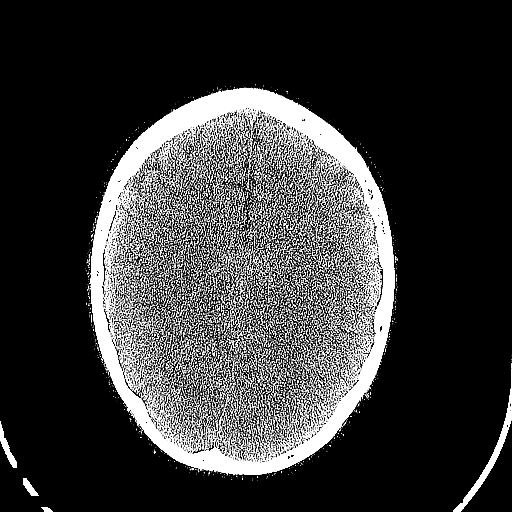
[im 51/78  brain]
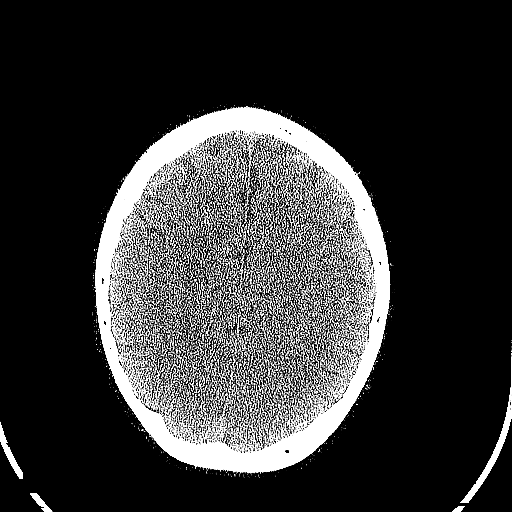
[im 54/78  brain]
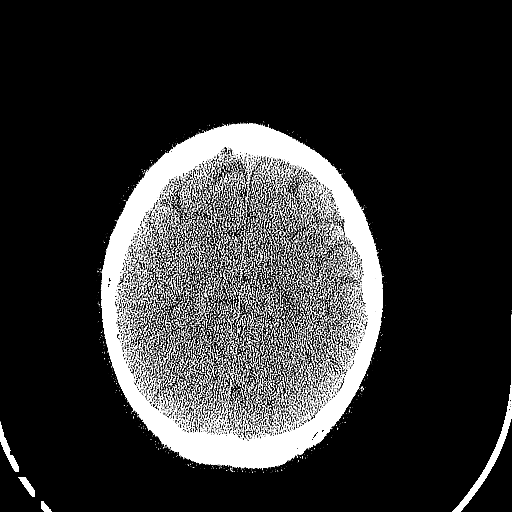
[im 58/78  brain]
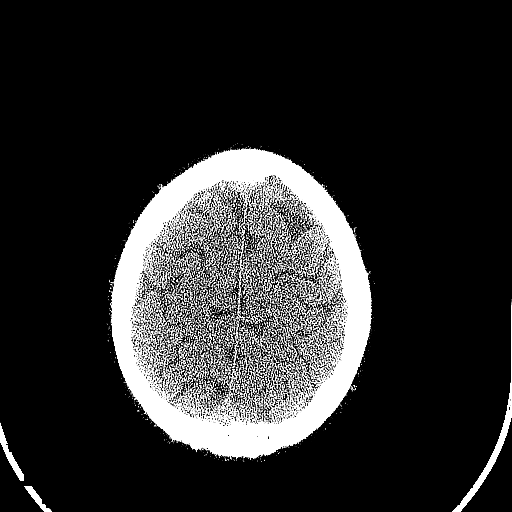
[im 58/78  bone]
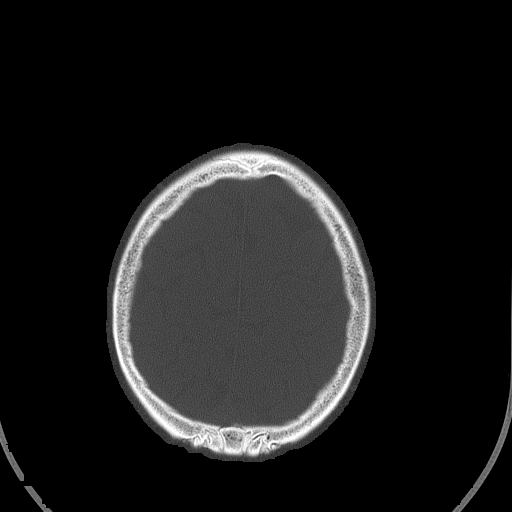
[im 66/78  brain]
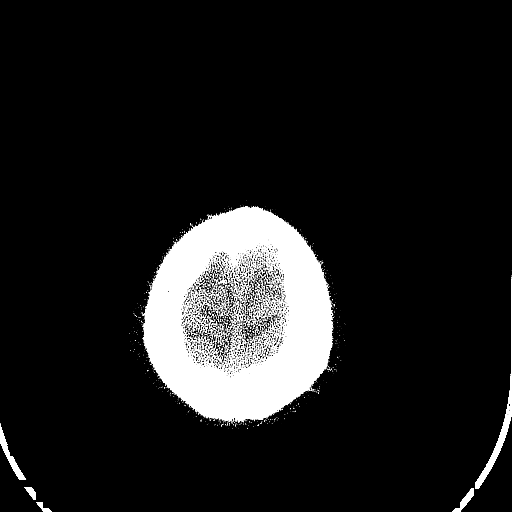
[im 70/78  brain]
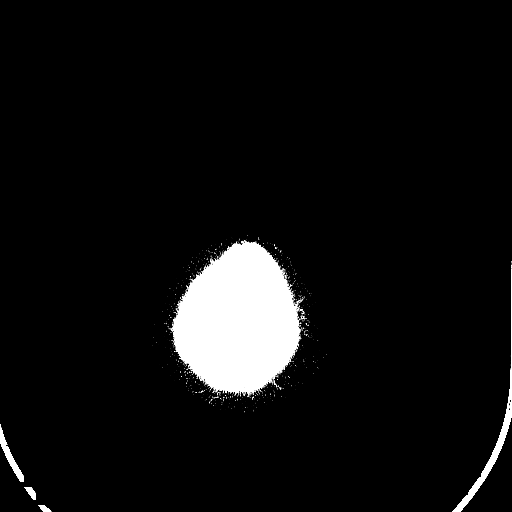
[im 74/78  brain]
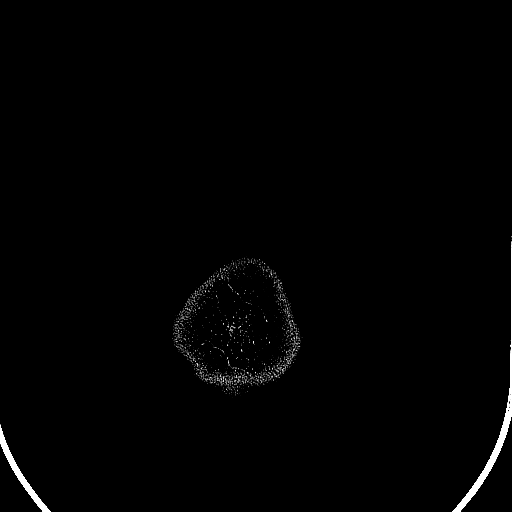

[16 of 30 positions shown; findings below may reference images not displayed]

FINDINGS: The ventricles and sulci are normal. No intraparenchymal hemorrhage,
mass effect nor midline shift. No acute large vascular territory
infarcts.

No abnormal extra-axial fluid collections. Basal cisterns are
patent.

No skull fracture. Minimal hyperostosis frontalis internus. The
included ocular globes and orbital contents are non-suspicious. Mild
paranasal sinus mucosal thickening without air-fluid levels. The
mastoid air cells are well aerated.
IMPRESSION: No acute intracranial process ; normal noncontrast CT of the head.

Mild paranasal sinusitis.

  By: Nomasibulele Mo[place]he

## 2016-04-15 IMAGING — CR DG CHEST 1V PORT
2 series · 2 of 2 positions shown · non-contrast
Comparison: 04/06/2014

CLINICAL DATA: Shortness of breath. Drug overdose. History of
asthma.

EXAM:
PORTABLE CHEST - 1 VIEW

[AP (1 of 2)]
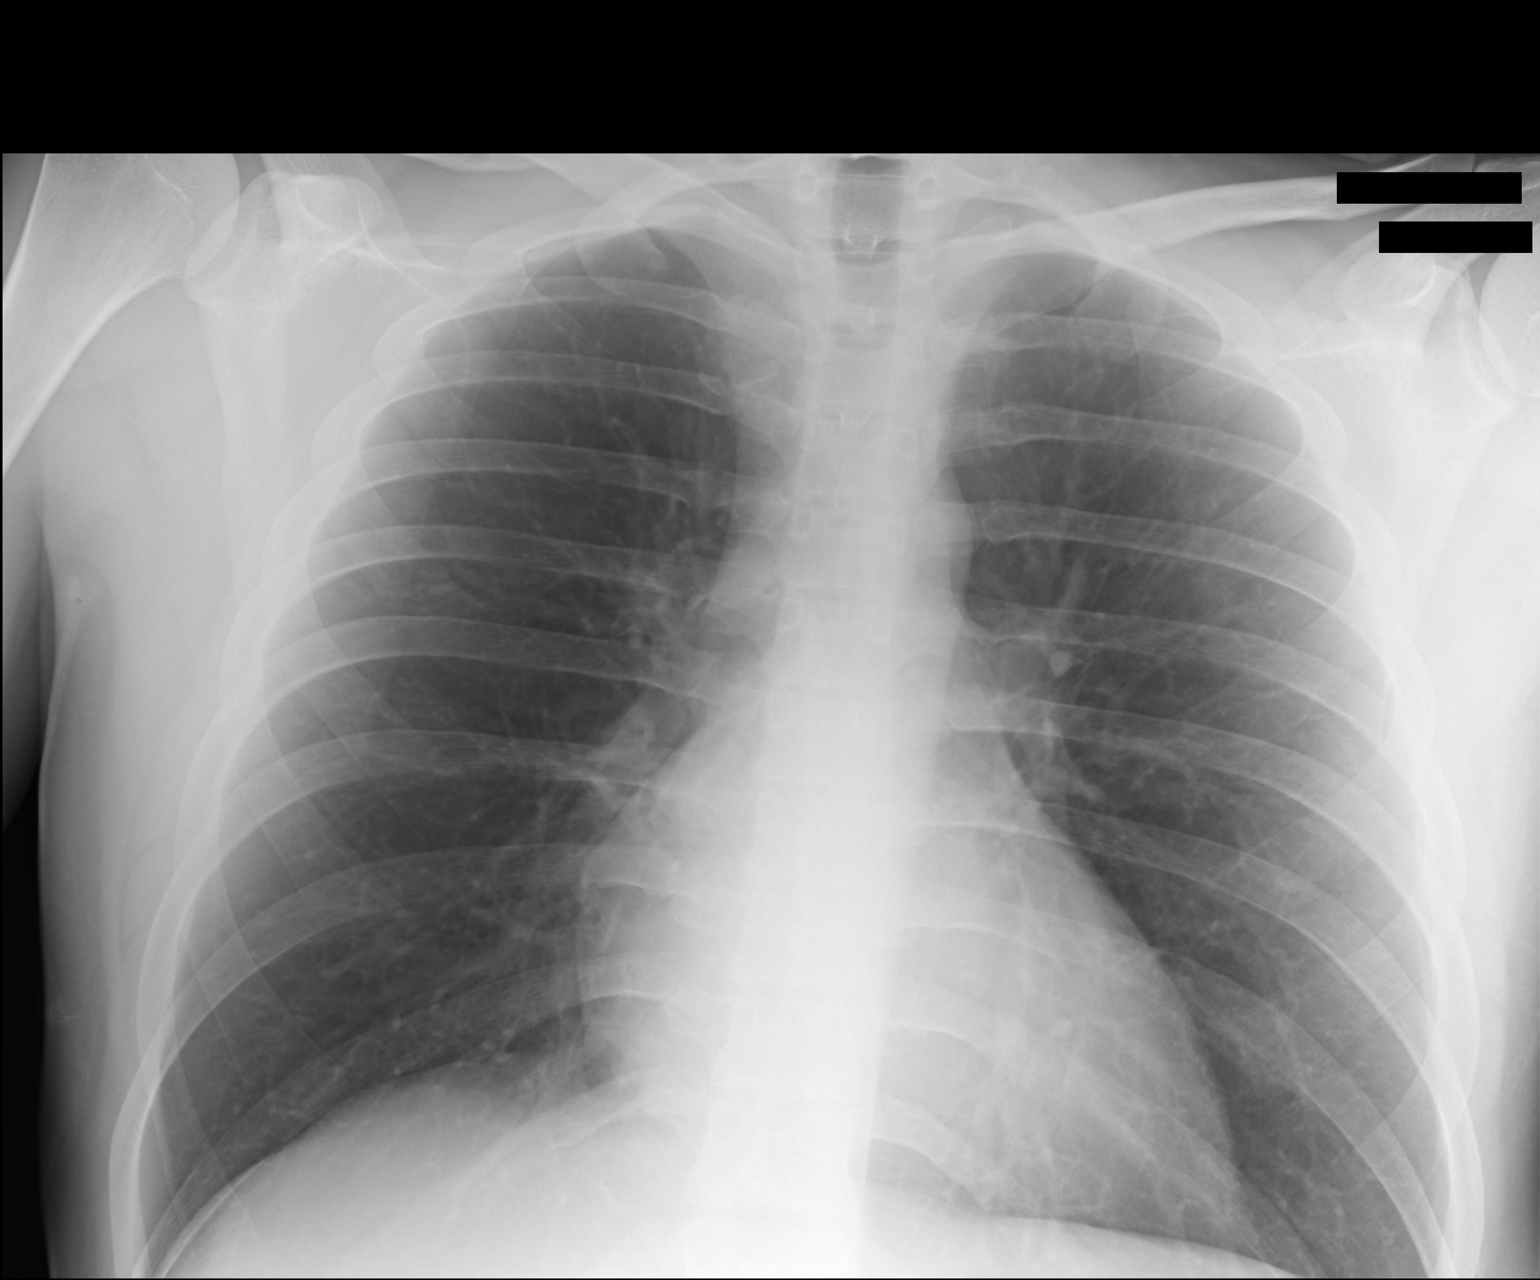

[AP (2 of 2)]
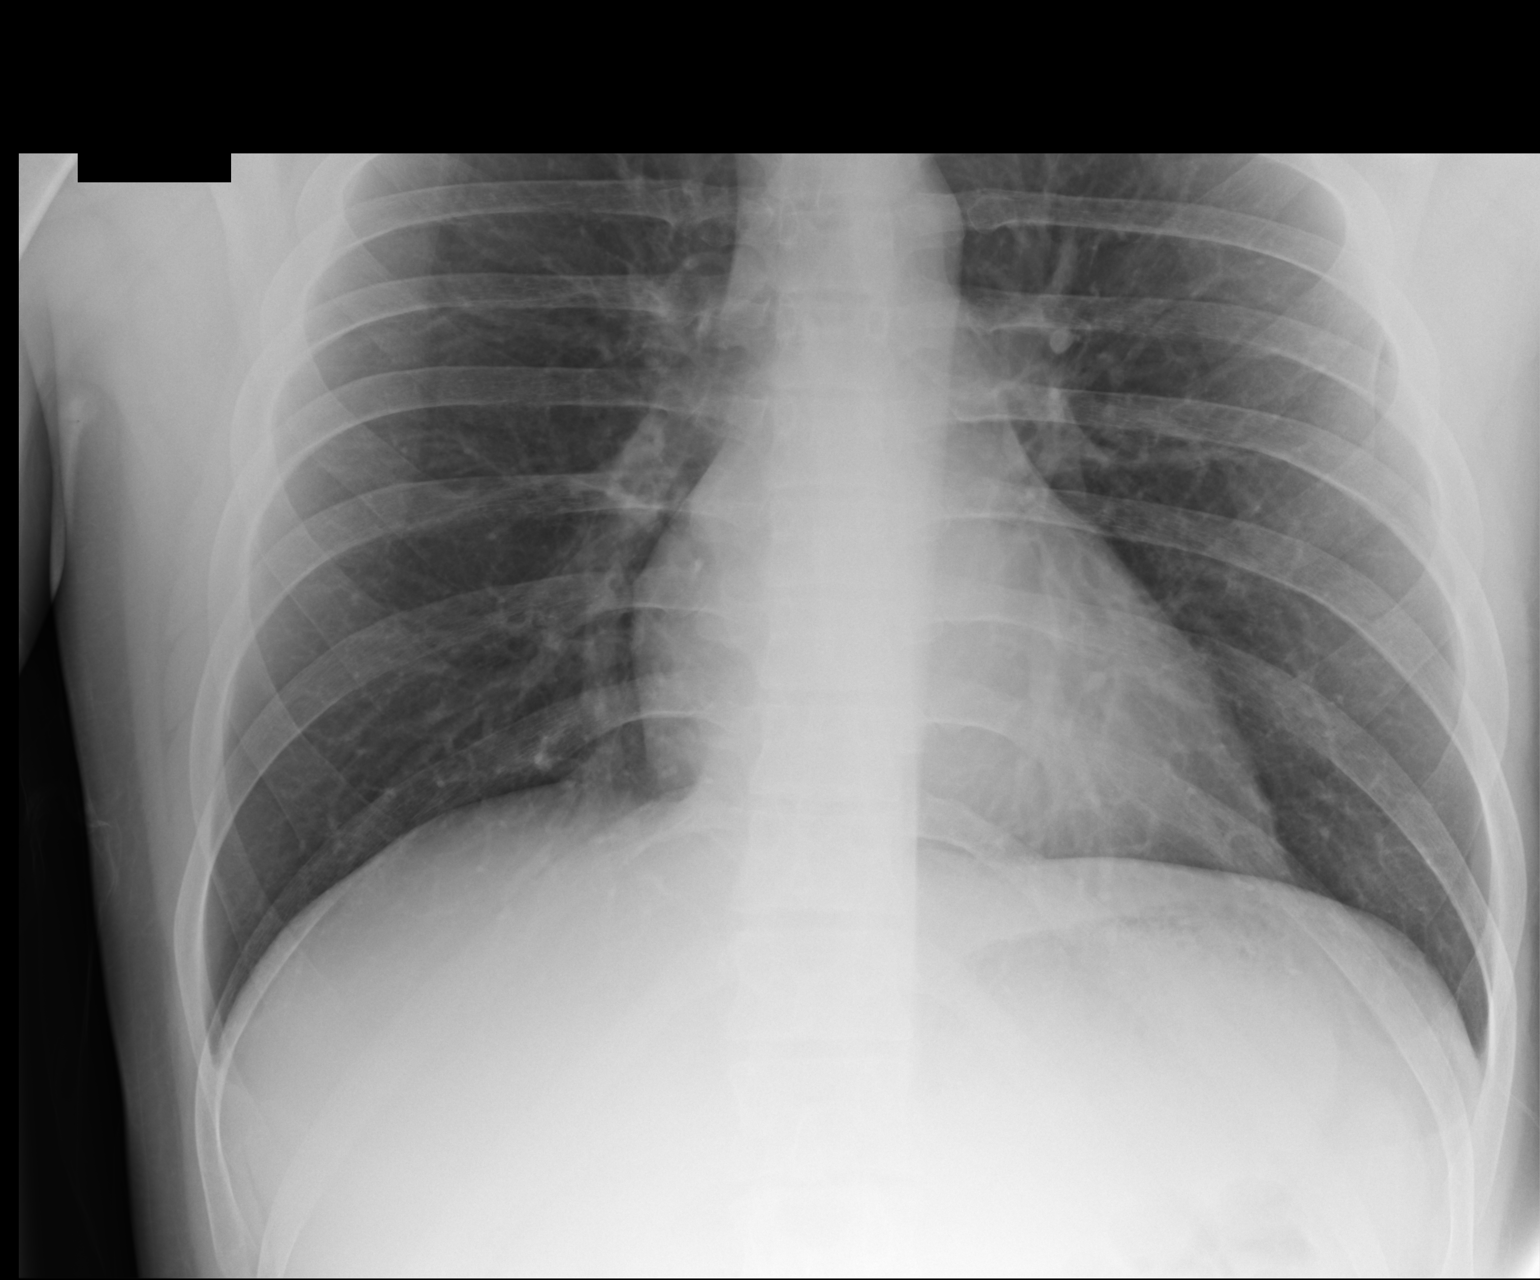

[2 of 2 positions shown; findings below may reference images not displayed]

FINDINGS: Normal heart size and pulmonary vascularity. No focal airspace
disease or consolidation in the lungs. No blunting of costophrenic
angles. No pneumothorax. Mediastinal contours appear intact. Old
fracture deformity of the right clavicle.
IMPRESSION: No active disease.

## 2021-11-29 ENCOUNTER — Ambulatory Visit: Payer: Self-pay | Admitting: Family Medicine
